# Patient Record
Sex: Male | Born: 1995 | Race: White | Hispanic: No | Marital: Married | Smoking: Never smoker
Health system: Southern US, Community
[De-identification: ages and names within clinical notes are randomized; demographics above are authoritative.]

## PROBLEM LIST (undated history)

## (undated) DIAGNOSIS — F909 Attention-deficit hyperactivity disorder, unspecified type: Secondary | ICD-10-CM

## (undated) HISTORY — PX: NO PAST SURGERIES: SHX2092

---
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
## 2014-05-23 ENCOUNTER — Ambulatory Visit (INDEPENDENT_AMBULATORY_CARE_PROVIDER_SITE_OTHER): Payer: Self-pay | Admitting: Sports Medicine

## 2014-05-23 ENCOUNTER — Encounter: Payer: Self-pay | Admitting: Sports Medicine

## 2014-05-23 VITALS — Ht 70.0 in | Wt 200.0 lb

## 2014-05-23 DIAGNOSIS — M24873 Other specific joint derangements of unspecified ankle, not elsewhere classified: Secondary | ICD-10-CM

## 2014-05-23 DIAGNOSIS — M24876 Other specific joint derangements of unspecified foot, not elsewhere classified: Secondary | ICD-10-CM

## 2014-05-23 DIAGNOSIS — G5620 Lesion of ulnar nerve, unspecified upper limb: Secondary | ICD-10-CM

## 2014-05-23 DIAGNOSIS — G5621 Lesion of ulnar nerve, right upper limb: Secondary | ICD-10-CM | POA: Insufficient documentation

## 2014-05-23 DIAGNOSIS — M25372 Other instability, left ankle: Secondary | ICD-10-CM

## 2014-05-23 MED ORDER — VITAMIN B-6 50 MG PO TABS: 50.0000 mg | ORAL_TABLET | Freq: Two times a day (BID) | ORAL | Status: DC

## 2014-05-23 NOTE — Progress Notes (Signed)
Subjective:    Patient ID: Casey Orozco, male    DOB: 05/26/1996, 18 y.oGwenyth Orozco.   MRN: [id number]  HPI Casey Orozco is a 18 year old male who presents with multiple complaints. Mainly he complains of right wrist pain and numbness, which he says started following an injury over a year ago. He describes that he was carrying a tire at work with the third fourth and fifth fingers and went to throw the tire, but felt immediate pain in his right elbow and wrist. His symptoms initially had resolved with bracing and ibuprofen, but he read aggravated this approximately 2-3 months ago. He notes mainly associated numbness of the fourth and fifth fingers, wrist pain, and numbness extending to the elbow. He denies any neck symptoms or numbness above the level of the elbow. He denies any snapping or popping sensation at the elbow or wrist. He denies any associated weakness. His pain does not wake him up at night.  He also complains of left ankle pain and instability across the anterior ankle joint. He denies any known acute injury. Onset was 4-5 months ago. He denies any swelling, bruising, giving way, numbness, tingling or weakness. Severity of pain is mild. He has not tried any interventions for this.  History   Social History  . Marital Status: Single    Spouse Name: N/A    Number of Children: N/A  . Years of Education: N/A   Occupational History  . 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 Smoking status: Never Smoker   . Smokeless tobacco: Not on file  . Alcohol Use: Not on file  . Drug Use: Not on file  . Sexual Activity: Not on file   Other Topics Concern  . Not on file   Social History Narrative  . No narrative on file   History reviewed. No pertinent past medical history.  Review of Systems As per history of present illness. 11 point review of systems was performed and is otherwise negative.    Objective:   Physical Exam Ht 5\' 10"  (1.778 m)  Wt 200 lb (90.719 kg)  BMI 28.70  kg/m2 GEN: The patient is well-developed well-nourished male and in no acute distress.  He is awake alert and oriented x3. SKIN: warm and well-perfused, no rash  EXTR: No lower extremity edema or calf tenderness Neuro: Strength 5/5 globally. Sensation intact throughout. DTRs 2/4 bilaterally. No focal deficits. Vasc: +2 bilateral distal pulses. No edema.  MSK: Examination of the right upper extremity reveals full range of motion at the fingers, elbow, and wrist without pain. Negative Tinel's sign at the wrist. No tenderness or pain with resisted flexion, extension, abduction, or abduction. No hypo-thenar wasting. No swelling or deformity. No palpable step or subluxation at the medial epicondyle with elbow flexion. No valgus or varus laxity at the elbow or wrist. Examination of the left ankle reveals a positive talar tilt test and +1 anterior drawer test. No tenderness to palpation. No swelling. No weakness with eversion or inversion of the ankle.  Limited musculoskeletal ultrasound: Short and long axis views were obtained of the right Upper extremity. The ulnar nerve has normal caliber and position of the wrist.  There was echogenicity seen within the TFCC at the wrist.  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  There was some disruption of the flexor retinaculum at the elbow.  The ulnar nerve at the elbow was with increased fluid density surrounding it.  The ulnar nerve  would move, but would still remain within the elbow groove with dynamic testing.    Assessment & Plan:  Please see problem based assessment and plan in the problem list.

## 2014-05-23 NOTE — Assessment & Plan Note (Signed)
-  Ultrasound dynamic testing demonstrated slight stretching of the flexor retinaculum containing the ulnar nerve at the elbow, which was also a bifid nerve with some fluid surrounding it. 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 -He is given a right elbow compression sleeve today. -We prescribed vitamin B6 50 mg twice daily for the next 2 months to support nerve healing. -We will see him back in 2-3 months if his symptoms persist.

## 2014-05-23 NOTE — Assessment & Plan Note (Signed)
-  We demonstrated ankle cone touches exercises, which he will continue at home. -We offered him a compression sleeve for the left ankle, but he will obtain a compression sleeve on his own. -He will followup in 2-3 months or sooner if symptoms acutely worsen.

## 2014-08-23 ENCOUNTER — Encounter: Payer: Self-pay | Admitting: Sports Medicine

## 2014-08-23 ENCOUNTER — Ambulatory Visit (INDEPENDENT_AMBULATORY_CARE_PROVIDER_SITE_OTHER): Payer: Self-pay | Admitting: Sports Medicine

## 2014-08-23 VITALS — BP 124/47

## 2014-08-23 DIAGNOSIS — M4000 Postural kyphosis, site unspecified: Secondary | ICD-10-CM | POA: Insufficient documentation

## 2014-08-23 DIAGNOSIS — G5621 Lesion of ulnar nerve, right upper limb: Secondary | ICD-10-CM

## 2014-08-23 DIAGNOSIS — M4004 Postural kyphosis, thoracic region: Secondary | ICD-10-CM

## 2014-08-23 NOTE — Assessment & Plan Note (Signed)
HEP per AVS

## 2014-08-23 NOTE — Patient Instructions (Signed)
Exercises:  HEAD UP AND BACK: Dead Lifts with 25# in each hand - with shoulder rolled back Back bend on with hands on the wall Wall stands with heels, buttock, shoulder, head on wall Bent over row 10# wts - pull shoulder blades back  Body Helix as needed Keep B6

## 2014-08-23 NOTE — Assessment & Plan Note (Addendum)
overall he seems to be markedly improved. 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  The single incident that the compression seemed to worsen likely represents some underlying swelling that is still in the area that was exacerbated with external compression.     Discussed appropriate prophylactic use of compression versus symptomatic treatment which likely exacerbated  Continue vitamin B6. Discussed no significant evidence supporting the use of this medication but given the minimal side effect profile and minimal cost worthwhile trying.

## 2014-08-23 NOTE — Progress Notes (Signed)
  Casey Orozco - 18 y.o. male MRN [id number]  Date of birth: 11/22/1995  CC & HPI:  Casey Orozco presents today for follow-up of: Right arm pain: Reports overall being significantly improved (60-70%).  He has been taking vitamin B6 and using the body helix compression sleeve around the elbow. He was compliant with this for quite some time but has recently been using this less often and trying to protect it by avoiding direct pressure over the medial elbow. He does report a single incident where he had minimal pain and put the body helix on after 2-3 hours of work and reported worsening of his symptoms. Overall he denies any significant weakness and overall the dysesthesia he was experiencing is significantly better.    he would also like to discuss family concerns for poor posture and kyphosis previously documented. He denies any significant upper extremity issues but would like recommendations for postural exercises  ROS:  Per HPI.   HISTORY: Past Medical, Surgical, Social, and Family History Reviewed & Updated per EMR.  Pertinent Historical Findings include: Works as a Journalist, newspaperauto mechanic    OBJECTIVE:  VS:   HT:    WT:   BMI:           BP:(!) 124/47 mmHg  HR: bpm  TEMP: ( )  RESP:   PHYSICAL EXAM: GENERAL: [place] adult Caucasian  male. In no discomfort; no respiratory distress   PSYCH: alert and appropriate, good insight   NEURO: sensation is intact to light touch in bilateral upper extremities, no dysesthesia at the hand   VASCULAR:  bilateral radial pulses 2+/4.  No significant edema.    Right upper extremity EXAM: Appearance:  overall normal-appearing, no significant swelling or deformity. No thenar or hypothenar atrophy   Skin: No overlying erythema/ecchymosis.  Palpation:  minimal TTP over the cubital tunnel. No significant swelling appreciated No TTP over: Distal forearm or hand   Strength, ROM & OtherTests:  grip strength 5 out of 5, wrist extension and wrist flexion and wrist on the  deviation and radial deviation strength 5 out of 5 with no limitations in range of motion Negative Tinel's at the cubital tunnel, positive Tinel's at Guyon's canal.    Posture/back EXAM: Overall anterior chain dominant. Bilateral shoulder protraction. Mild kyphosis without significant scoliosis. No overlying erythema/ecchymosis. No TTP over: Bilateral paraspinal musculature.  He does have a flexible kyphosis and full cervical range of motion.   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 Postural kyphosis of thoracic region    PLAN: See problem based charting & AVS for additional documentation. > Return if symptoms worsen or fail to improve.

## 2015-09-09 ENCOUNTER — Ambulatory Visit (INDEPENDENT_AMBULATORY_CARE_PROVIDER_SITE_OTHER): Payer: BLUE CROSS/BLUE SHIELD | Admitting: Family Medicine

## 2015-09-09 ENCOUNTER — Encounter: Payer: Self-pay | Admitting: Family Medicine

## 2015-09-09 VITALS — BP 126/70 | Ht 71.0 in | Wt 204.0 lb

## 2015-09-09 DIAGNOSIS — M791 Myalgia: Secondary | ICD-10-CM | POA: Diagnosis not present

## 2015-09-09 DIAGNOSIS — M7918 Myalgia, other site: Secondary | ICD-10-CM

## 2015-09-09 NOTE — Progress Notes (Signed)
   Subjective:    Patient ID: Casey Orozco, male    DOB: 02/22/1996, 19 y.o.   MRN: [id number]  HPI Mr. Casey Orozco is a 19yo male presenting today for right shoulder pain. 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 Pain improves after 1-2 days off of work. - Has been using Ibuprofen, which helps - Denies decreased ROM, decreased strength, numbness, and tingling - Reports history of aggravated nerve in elbow which lasted from October 2015 to the spring of 2016. - Right hand dominant   Review of Systems Per HPI    Objective:   Physical Exam  Constitutional: He appears well-developed and well-nourished. No distress.  Pulmonary/Chest: Effort normal. No respiratory distress.  Neurological:  Shoulders symmetric bilaterally. No tenderness to palpation over shoulder or scapula. No numbness noted. Muscle strength 5/5 in upper extremities bilaterally. No winging of scapula noted. Negative Hawkins. Negative O'Briens. Negative cross body with resistance.   Psychiatric: He has a normal mood and affect. His behavior is normal.      Assessment & Plan:  # 1: Rhomboid Tendonitis: Exam negative given rest over last 2-3 days, however reports intermittent pain at site of right rhomboid. Currently taking Ibuprofen PRN pain.  Plan: - Continue Ibuprofen PRN for pain - Exercises given, including push ups to target rhomboids - Follow up as needed.

## 2015-09-11 NOTE — Progress Notes (Signed)
Patient ID: Casey Orozco, male   DOB: 12/28/1995, 19 y.o.   MRN: [id number] [facility] Attending Note: I have seen and examined this patient. I have discussed this patient with the resident and reviewed the assessment and plan as documented above. I agree with the resident's findings and plan. Rhomboid muscle strain.  We discussed exercises.  He can follow-up when necessary.  If it does not improve, I would like him to return.

## 2017-02-24 ENCOUNTER — Encounter (INDEPENDENT_AMBULATORY_CARE_PROVIDER_SITE_OTHER): Payer: Self-pay

## 2017-02-24 ENCOUNTER — Ambulatory Visit (INDEPENDENT_AMBULATORY_CARE_PROVIDER_SITE_OTHER): Payer: BLUE CROSS/BLUE SHIELD | Admitting: Allergy and Immunology

## 2017-02-24 ENCOUNTER — Encounter: Payer: Self-pay | Admitting: Allergy and Immunology

## 2017-02-24 VITALS — BP 134/72 | HR 84 | Temp 97.7°F | Resp 16 | Ht 70.0 in | Wt 212.8 lb

## 2017-02-24 DIAGNOSIS — J3089 Other allergic rhinitis: Secondary | ICD-10-CM | POA: Diagnosis not present

## 2017-02-24 DIAGNOSIS — H1045 Other chronic allergic conjunctivitis: Secondary | ICD-10-CM

## 2017-02-24 DIAGNOSIS — H1010 Acute atopic conjunctivitis, unspecified eye: Secondary | ICD-10-CM

## 2017-02-24 MED ORDER — MONTELUKAST SODIUM 10 MG PO TABS: 10.0000 mg | ORAL_TABLET | Freq: Every day | ORAL | 5 refills | Status: DC

## 2017-02-24 MED ORDER — OLOPATADINE HCL 0.2 % OP SOLN: 1.0000 [drp] | Freq: Every day | OPHTHALMIC | 5 refills | Status: DC | PRN

## 2017-02-24 MED ORDER — TRIAMCINOLONE ACETONIDE 55 MCG/ACT NA AERO: 1.0000 | INHALATION_SPRAY | Freq: Every day | NASAL | 5 refills | Status: AC | PRN

## 2017-02-24 NOTE — Progress Notes (Signed)
Dear Dr. Waynard EdwardsPerini,  Thank you for referring Casey Orozco to the [facility] [place] on 02/24/2017.   Below is a summation of this patient's evaluation and recommendations.  Thank you for your referral. I will keep you informed about this patient's response to treatment.   If you have any questions please do not hesitate to contact me.   Sincerely,  Jessica PriestEric J. Kozlow, MD Allergy / Immunology [facility]   ______________________________________________________________________    NEW PATIENT NOTE  Referring Provider: Rodrigo RanPerini, Mark, MD Primary Provider: Rodrigo RanPerini, Mark, MD Date of office visit: 02/24/2017    Subjective:   Chief Complaint:  Casey Orozco (DOB: 01/29/1996) is a 21 y.o. male who presents to the clinic on 02/24/2017 with a chief complaint of New Evaluation and Allergy Testing (No antihistamines x 3 days. ) .     HPI: Casey Orozco presents to this clinic in evaluation of allergies. He has a several year history of perennial issues with nasal congestion and sneezing and itchy red watery eyes that flare during the spring time and appear to be triggered by exposure to the outdoors and exposure to dust. He does not have any significant headaches other than some sinus pressure during the peak of his allergy season and he does not have a history of anosmia or decreased ability to taste or ugly nasal discharge.  He has tried various antihistamines and nasal steroids in the past which do help but he still remains symptomatic in the face of this therapy.  He does not have any active atopic disease other than described above. He did have a history of early eczema that has since resolved.  History reviewed. No pertinent past medical history.  Past Surgical History:  Procedure Laterality Date  . NO PAST SURGERIES      Allergies as of 02/24/2017      Reactions   Ampicillin       Medication List        CONCERTA 36 MG CR tablet Generic drug:  methylphenidate   fexofenadine 180 MG tablet Commonly known as:  ALLEGRA Take 180 mg by mouth daily.   NASACORT ALLERGY 24HR NA Place 1 spray into the nose daily as needed.       Review of systems negative except as noted in HPI / PMHx or noted below:  Review of Systems  Constitutional: Negative.   HENT: Negative.   Eyes: Negative.   Respiratory: Negative.   Cardiovascular: Negative.   Gastrointestinal: Negative.   Genitourinary: Negative.   Musculoskeletal: Negative.   Skin: Negative.   Neurological: Negative.   Endo/Heme/Allergies: Negative.   Psychiatric/Behavioral: Negative.     Family History  Problem Relation Age of Onset  . Allergic rhinitis Father   . Angioedema Neg Hx   . Atopy Neg Hx   . Eczema Neg Hx   . Immunodeficiency Neg Hx   . Urticaria Neg Hx   . Asthma Neg Hx     Social History   Social History  . Marital status: Single    Spouse name: N/A  . Number of children: N/A  . Years of education: N/A   Occupational History  . 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 Smoking status: Never Smoker  . Smokeless tobacco: Never Used  . Alcohol use Yes     Comment: occasional   . Drug use: No  . Sexual activity: Not on file  Other Topics Concern  . Not on file   Social History Narrative  . No narrative on file    Environmental and Social history  Lives in a house with a dry environment, a dog located inside the household, carpeting in the bedroom, no plastic on the bed or pillow, and no smoking ongoing with inside the household. 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  Objective:   Vitals:   02/24/17 0841  BP: 134/72  Pulse: 84  Resp: 16  Temp: 97.7 F (36.5 C)   Height: 5\' 10"  (177.8 cm) Weight: 212 lb 12.8 oz (96.5 kg)  Physical Exam  Constitutional: He is well-developed, well-nourished, and in no distress.  Allergic shiners  HENT:   Head: Normocephalic. Head is without right periorbital erythema and without left periorbital erythema.  Right Ear: Tympanic membrane, external ear and ear canal normal.  Left Ear: Tympanic membrane, external ear and ear canal normal.  Nose: Mucosal edema and septal deviation present. No rhinorrhea.  Mouth/Throat: Uvula is midline, oropharynx is clear and moist and mucous membranes are normal. No oropharyngeal exudate.  Eyes: Conjunctivae and lids are normal. Pupils are equal, round, and reactive to light.  Neck: Trachea normal. No tracheal tenderness present. No tracheal deviation present. No thyromegaly present.  Cardiovascular: Normal rate, regular rhythm, S1 normal, S2 normal and normal heart sounds.   No murmur heard. Pulmonary/Chest: Effort normal and breath sounds normal. No stridor. No tachypnea. No respiratory distress. He has no wheezes. He has no rales. He exhibits no tenderness.  Abdominal: Soft. He exhibits no distension and no mass. There is no hepatosplenomegaly. There is no tenderness. There is no rebound and no guarding.  Musculoskeletal: He exhibits no edema or tenderness.  Lymphadenopathy:       Head (right side): No tonsillar adenopathy present.       Head (left side): No tonsillar adenopathy present.    He has no cervical adenopathy.    He has no axillary adenopathy.  Neurological: He is alert. Gait normal.  Skin: No rash noted. He is not diaphoretic. No erythema. No pallor. Nails show no clubbing.  Psychiatric: Mood and affect normal.    Diagnostics: Allergy skin tests were performed. He demonstrated hypersensitivity to trees, weeds, house dust mite, and cat.  Assessment and Plan:    1. Other allergic rhinitis   2. Seasonal allergic conjunctivitis     1. Allergen avoidance measures  2. Treat and prevent inflammation:   A. OTC Nasacort / Rhinocort - one spray each nostril one time per day  B. montelukast 10 mg tablet 1 time per day  3. If needed:   A.  nasal saline wash  B. OTC antihistamine  C. Pataday one drop each eye one time per day  4. Consider a course of immunotherapy  5. Return to clinic in 6 months or earlier if problem  Ladene Artist appears to have atopic disease that has not responded adequately to previous medical treatment and I'm not really sure that he is going to get an adequate response with the therapy described above. I have given him literature on immunotherapy to treat his atopic condition during today's visit and he is presently considering this option. I will see him back in this clinic in 6 months or earlier if there is a problem.  Jessica Priest, MD  [facility]

## 2017-02-24 NOTE — Patient Instructions (Addendum)
  1. Allergen avoidance measures  2. Treat and prevent inflammation:   A. OTC Nasacort / Rhinocort - one spray each nostril one time per day  B. montelukast 10 mg tablet 1 time per day  3. If needed:   A. nasal saline wash  B. OTC antihistamine  C. Pataday one drop each eye one time per day  4. Consider a course of immunotherapy  5. Return to clinic in 6 months or earlier if problem

## 2017-05-10 ENCOUNTER — Encounter (INDEPENDENT_AMBULATORY_CARE_PROVIDER_SITE_OTHER): Payer: Self-pay

## 2017-05-10 ENCOUNTER — Ambulatory Visit (HOSPITAL_COMMUNITY): Payer: BLUE CROSS/BLUE SHIELD | Attending: Internal Medicine

## 2017-05-10 ENCOUNTER — Other Ambulatory Visit: Payer: Self-pay

## 2017-05-10 ENCOUNTER — Other Ambulatory Visit: Payer: Self-pay | Admitting: Internal Medicine

## 2017-05-10 DIAGNOSIS — R011 Cardiac murmur, unspecified: Secondary | ICD-10-CM | POA: Diagnosis present

## 2019-06-30 ENCOUNTER — Other Ambulatory Visit: Payer: Self-pay

## 2019-06-30 DIAGNOSIS — Z20822 Contact with and (suspected) exposure to covid-19: Secondary | ICD-10-CM

## 2019-07-01 LAB — NOVEL CORONAVIRUS, NAA: SARS-CoV-2, NAA: NOT DETECTED

## 2020-08-17 ENCOUNTER — Encounter (HOSPITAL_COMMUNITY): Payer: Self-pay | Admitting: *Deleted

## 2020-08-17 ENCOUNTER — Other Ambulatory Visit: Payer: Self-pay

## 2020-08-17 ENCOUNTER — Ambulatory Visit (HOSPITAL_COMMUNITY)
Admission: EM | Admit: 2020-08-17 | Discharge: 2020-08-17 | Disposition: A | Discharge status: Home / Self Care | Attending: Emergency Medicine | Admitting: Emergency Medicine

## 2020-08-17 DIAGNOSIS — I493 Ventricular premature depolarization: Secondary | ICD-10-CM | POA: Diagnosis present

## 2020-08-17 DIAGNOSIS — R002 Palpitations: Secondary | ICD-10-CM | POA: Diagnosis present

## 2020-08-17 HISTORY — DX: Attention-deficit hyperactivity disorder, unspecified type: F90.9

## 2020-08-17 LAB — BASIC METABOLIC PANEL
Anion gap: 10 (ref 5–15)
BUN: 10 mg/dL (ref 6–20)
CO2: 28 mmol/L (ref 22–32)
Calcium: 9.9 mg/dL (ref 8.9–10.3)
Chloride: 100 mmol/L (ref 98–111)
Creatinine, Ser: 0.92 mg/dL (ref 0.61–1.24)
GFR, Estimated: >60 mL/min (ref >60)
Glucose, Bld: 103 mg/dL — ABNORMAL HIGH (ref 70–99)
Potassium: 3.9 mmol/L (ref 3.5–5.1)
Sodium: 138 mmol/L (ref 135–145)

## 2020-08-17 LAB — CBC
HCT: 46.3 % (ref 39.0–52.0)
Hemoglobin: 15.6 g/dL (ref 13.0–17.0)
MCH: 28.6 pg (ref 26.0–34.0)
MCHC: 33.7 g/dL (ref 30.0–36.0)
MCV: 85 fL (ref 80.0–100.0)
Platelets: 291 10*3/uL (ref 150–400)
RBC: 5.45 MIL/uL (ref 4.22–5.81)
RDW: 12.2 % (ref 11.5–15.5)
WBC: 7.3 10*3/uL (ref 4.0–10.5)
nRBC: 0 % (ref 0.0–0.2)

## 2020-08-17 LAB — TSH: TSH: 1.244 u[IU]/mL (ref 0.350–4.500)

## 2020-08-17 NOTE — ED Notes (Signed)
Pt in registration bay.  Upon palpation, HR regular with occasional skipped beats.  Skin warm and dry.  Pt talking calmly.

## 2020-08-17 NOTE — Discharge Instructions (Signed)
Please be sure to stay hydrated, avoid caffeine temporarily Follow-up with cardiology next week if symptoms persisting I'm checking blood work, I will call if anything is abnormal

## 2020-08-17 NOTE — ED Provider Notes (Signed)
[facility]    CSN: [id number] Arrival date & time: 08/17/20  1340      History   Chief Complaint Chief Complaint  Patient presents with  . Irregular Heart Beat    HPI Casey Orozco is a 24 y.o. male presenting today for evaluation of palpitations.  Reports that he has had sensations of his heart skipping a beat every 4-15 beats.  He denies any associated chest pain or lightheadedness.  Occasionally when symptoms occur frequently he will feel slightly short of breath/week. Reports with exertion, more frequent. 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 Reports increased stress recently. 1 cup of coffee daily, normal. Denies tobacco, recreational drugs. Rare alcohol use. Denies any history of heart problems. Denies any associated dizziness or lightheadedness. Symptoms take breath away briefly, but denies any shortness of breath or persistent issues with breathing.  HPI  Past Medical History:  Diagnosis Date  . ADHD     Patient Active Problem List   Diagnosis Date Noted  . 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 NO PAST SURGERIES         Home Medications    Prior to Admission medications   Medication Sig Start Date End Date Taking? Authorizing Provider  CONCERTA 36 MG CR tablet  08/27/15   [provider]  fexofenadine (ALLEGRA) 180 MG tablet Take 180 mg by mouth daily.    [provider]  montelukast (SINGULAIR) 10 MG tablet Take 1 tablet (10 mg total) by mouth at bedtime. 02/24/17   Kozlow, Alvira Philips, MD  Olopatadine HCl (PATADAY) 0.2 % SOLN Place 1 drop into both eyes daily as needed. 02/24/17   Kozlow, Alvira Philips, MD  triamcinolone (NASACORT ALLERGY 24HR) 55 MCG/ACT AERO nasal inhaler Place 1 spray into the nose daily as needed. 02/24/17   Kozlow, Alvira Philips, MD    Family History Family History   Problem Relation Age of Onset  . Allergic rhinitis Father   . Healthy Father   . Healthy Mother   . Angioedema Neg Hx   . Atopy Neg Hx   . Eczema Neg Hx   . Immunodeficiency Neg Hx   . Urticaria Neg Hx   . Asthma Neg Hx     Social History Social History   Tobacco Use  . Smoking status: Never Smoker  . Smokeless tobacco: Never Used  Vaping Use  . Vaping Use: Never assessed  Substance Use Topics  . Alcohol use: Yes    Comment: rare  . Drug use: Never     Allergies   Ampicillin   Review of Systems Review of Systems  Constitutional: Negative for fatigue and fever.  Eyes: Negative for redness, itching and visual disturbance.  Respiratory: Negative for shortness of breath.   Cardiovascular: Positive for palpitations. Negative for chest pain and leg swelling.  Gastrointestinal: Negative for nausea and vomiting.  Musculoskeletal: Negative for arthralgias and myalgias.  Skin: Negative for color change, rash and wound.  Neurological: Negative for dizziness, syncope, weakness, light-headedness and headaches.     Physical Exam Triage Vital Signs ED Triage Vitals  Enc Vitals Group     BP 08/17/20 1350 (!) 145/81     Pulse Rate 08/17/20 1350 86     Resp 08/17/20 1350 16     Temp 08/17/20 1350 98.4 F (36.9 C)  Temp Source 08/17/20 1350 Oral     SpO2 08/17/20 1350 98 %     Weight --      Height --      Head Circumference --      Peak Flow --      Pain Score 08/17/20 1352 0     Pain Loc --      Pain Edu? --      Excl. in GC? --    No data found.  Updated Vital Signs BP (!) 145/81   Pulse 86   Temp 98.4 F (36.9 C) (Oral)   Resp 16   SpO2 98%   Visual Acuity Right Eye Distance:   Left Eye Distance:   Bilateral Distance:    Right Eye Near:   Left Eye Near:    Bilateral Near:     Physical Exam Vitals and nursing note reviewed.  Constitutional:      Appearance: He is well-developed.     Comments: No acute distress  HENT:     Head:  Normocephalic and atraumatic.     Nose: Nose normal.  Eyes:     Conjunctiva/sclera: Conjunctivae normal.  Cardiovascular:     Rate and Rhythm: Normal rate. Rhythm irregular.     Comments: Regular with occasional premature beat Pulmonary:     Effort: Pulmonary effort is normal. No respiratory distress.     Comments: Breathing comfortably at rest, CTABL, no wheezing, rales or other adventitious sounds auscultated Abdominal:     General: There is no distension.  Musculoskeletal:        General: Normal range of motion.     Cervical back: Neck supple.  Skin:    General: Skin is warm and dry.  Neurological:     Mental Status: He is alert and oriented to person, place, and time.      UC Treatments / Results  Labs (all labs ordered are listed, but only abnormal results are displayed) Labs Reviewed  CBC  BASIC METABOLIC PANEL  TSH    EKG   Radiology No results found.  Procedures Procedures (including critical care time)  Medications Ordered in UC Medications - No data to display  Initial Impression / Assessment and Plan / UC Course  I have reviewed the triage vital signs and the nursing notes.  Pertinent labs & imaging results that were available during my care of the patient were reviewed by me and considered in my medical decision making (see chart for details).     EKG normal sinus rhythm with occasional PVC. PVC auscultated approximately every 10 beats. Given new and symptomatic to patient will have follow-up with cardiology for further evaluation. Recommended to avoid caffeine temporarily, checking basic labs to ensure electrolytes thyroid and hemoglobin stable.  Discussed strict return precautions. Patient verbalized understanding and is agreeable with plan.  Final Clinical Impressions(s) / UC Diagnoses   Final diagnoses:  Palpitations  Frequent PVCs     Discharge Instructions     Please be sure to stay hydrated, avoid caffeine temporarily Follow-up with  cardiology next week if symptoms persisting I'm checking blood work, I will call if anything is abnormal     ED Prescriptions    None     PDMP not reviewed this encounter.   Lew Dawes, PA-C 08/17/20 1418

## 2020-08-17 NOTE — ED Triage Notes (Signed)
Pt reports intermittent episodes of feeling palpitations.  States has noticed skipped beats every 4-15 beats.  Denies any chest pain or lightheadedness.  States "when it happens frequently and closer together, I feel a little weak or SOB".

## 2020-09-14 DIAGNOSIS — I493 Ventricular premature depolarization: Secondary | ICD-10-CM | POA: Insufficient documentation

## 2020-09-14 NOTE — Progress Notes (Signed)
Cardiology Office Note   Date:  09/16/2020   ID:  Casey Orozco, DOB May 24, 1996, MRN [id number]  PCP:  Rodrigo Ran, MD  Cardiologist:   No primary care provider on file. Referring:  Rodrigo Ran, MD  Chief Complaint  Patient presents with  . Palpitations      History of Present Illness: Casey Orozco is a 24 y.o. male who is referred by Rodrigo Ran, MD for evaluation of palpitations.   He was in the ED for this.   I reviewed these records for this visit.   He had PVCs about every 10 beats.     These appear to be premature atrial contractions.  He said this was going on at that time really on the day before he presented.  It was happening every several beats.  It was persistent for about 3 days.  In the emergency room the EKG demonstrated the ectopic beats.  TSH was normal.  Basic metabolic profile was normal.  He subsequently had resolution of his symptoms.  He has no prior cardiac history.  He is active as a Music therapist.  He denies any cardiovascular symptoms otherwise.  He has no chest discomfort, neck or arm discomfort.  He has no shortness of breath, PND or orthopnea.  He has had no weight gain or edema.  Of note he has an 57-month-old child and he just had premature 55-month-old twins.  They are selling a house and getting ready to buy another house.  Past Medical History:  Diagnosis Date  . ADHD     Past Surgical History:  Procedure Laterality Date  . NO PAST SURGERIES       Current Outpatient Medications  Medication Sig Dispense Refill  . triamcinolone (NASACORT ALLERGY 24HR) 55 MCG/ACT AERO nasal inhaler Place 1 spray into the nose daily as needed. 1 Inhaler 5   No current facility-administered medications for this visit.    Allergies:   Ampicillin    Social History:  The patient  reports that he has never smoked. He has never used smokeless tobacco. He reports current alcohol use. He reports that he does not use drugs.   Family History:  The patient's family  history includes Allergic rhinitis in his father; Healthy in his father and mother; Hypertension in his father.    ROS:  Please see the history of present illness.   Otherwise, review of systems are positive for none.   All other systems are reviewed and negative.    PHYSICAL EXAM: VS:  BP 128/62   Pulse 77   Ht 5\' 11"  (1.803 m)   Wt 243 lb 12.8 oz (110.6 kg)   BMI 34.00 kg/m  , BMI Body mass index is 34 kg/m. GENERAL:  Well appearing HEENT:  Pupils equal round and reactive, fundi not visualized, oral mucosa unremarkable NECK:  No jugular venous distention, waveform within normal limits, carotid upstroke brisk and symmetric, no bruits, no thyromegaly LYMPHATICS:  No cervical, inguinal adenopathy LUNGS:  Clear to auscultation bilaterally BACK:  No CVA tenderness CHEST:  Unremarkable HEART:  PMI not displaced or sustained,S1 and S2 within normal limits, no S3, no S4, no clicks, no rubs, no murmurs ABD:  Flat, positive bowel sounds normal in frequency in pitch, no bruits, no rebound, no guarding, no midline pulsatile mass, no hepatomegaly, no splenomegaly EXT:  2 plus pulses throughout, no edema, no cyanosis no clubbing SKIN:  No rashes no nodules NEURO:  Cranial nerves II through XII grossly intact, motor grossly  intact throughout [facility]:  Cognitively intact, oriented to person place and time    EKG:  EKG is ordered today. The ekg ordered today demonstrates sinus rhythm, rate 77, axis within normal limits, intervals within normal limits, no acute ST-T wave changes.   Recent Labs: 08/17/2020: BUN 10; Creatinine, Ser 0.92; Hemoglobin 15.6; Platelets 291; Potassium 3.9; Sodium 138; TSH 1.244    Lipid Panel No results found for: CHOL, TRIG, HDL, CHOLHDL, VLDL, LDLCALC, LDLDIRECT    Wt Readings from Last 3 Encounters:  09/16/20 243 lb 12.8 oz (110.6 kg)  02/24/17 212 lb 12.8 oz (96.5 kg)  09/09/15 204 lb (92.5 kg)      Other studies Reviewed: Additional studies/ records that  were reviewed today include: ED records. Review of the above records demonstrates:  Please see elsewhere in the note.     ASSESSMENT AND PLAN:  Palpitations: The patient has premature ectopic contractions which appear to be atrial with aberrancy.  However, he has a normal physical exam.  Labs were unremarkable.  They seem to have resolved.  He has lots of physical and emotional stress.  He actually seems to handle this relatively well.  At this point I doubt that he is having any structural heart disease and I do not suspect more significant dysrhythmia.  We talked about possible as needed dosing with a beta-blocker but he would like to hold off.  He will let me know if he would like propranolol in the future as needed.  No further testing at this point.   Current medicines are reviewed at length with the patient today.  The patient does not have concerns regarding medicines.  The following changes have been made:  no change  Labs/ tests ordered today include: None  Orders Placed This Encounter  Procedures  . EKG 12-Lead     Disposition:   FU with me as needed   Signed, Rollene Rotunda, MD  09/16/2020 10:24 AM    [facility]

## 2020-09-16 ENCOUNTER — Ambulatory Visit (INDEPENDENT_AMBULATORY_CARE_PROVIDER_SITE_OTHER): Admitting: Cardiology

## 2020-09-16 ENCOUNTER — Encounter: Payer: Self-pay | Admitting: Cardiology

## 2020-09-16 ENCOUNTER — Other Ambulatory Visit: Payer: Self-pay

## 2020-09-16 VITALS — BP 128/62 | HR 77 | Ht 71.0 in | Wt 243.8 lb

## 2020-09-16 DIAGNOSIS — R002 Palpitations: Secondary | ICD-10-CM

## 2020-09-16 NOTE — Patient Instructions (Signed)
Medication Instructions:  No changes *If you need a refill on your cardiac medications before your next appointment, please call your pharmacy*  Lab Work: None ordered this visit  Testing/Procedures: None ordered  Follow-Up: At [facility], you and your health needs are our priority.  As part of our continuing mission to provide you with exceptional heart care, we have created designated Provider Care Teams.  These Care Teams include your primary Cardiologist (physician) and Advanced Practice Providers (APPs -  Physician Assistants and Nurse Practitioners) who all work together to provide you with the care you need, when you need it.  We recommend signing up for the patient portal called "MyChart".  Sign up information is provided on this After Visit Summary.  MyChart is used to connect with patients for Virtual Visits (Telemedicine).  Patients are able to view lab/test results, encounter notes, upcoming appointments, etc.  Non-urgent messages can be sent to your provider as well.   To learn more about what you can do with MyChart, go to [email or web address].    Your next appointment:   Follow up as needed

## 2023-11-18 ENCOUNTER — Other Ambulatory Visit (HOSPITAL_COMMUNITY): Payer: Self-pay | Admitting: Adult Health

## 2023-11-18 ENCOUNTER — Ambulatory Visit (HOSPITAL_COMMUNITY)
Admission: RE | Admit: 2023-11-18 | Discharge: 2023-11-18 | Disposition: A | Discharge status: Home / Self Care | Payer: Medicaid Other | Source: Ambulatory Visit | Attending: Adult Health | Admitting: Adult Health

## 2023-11-18 ENCOUNTER — Other Ambulatory Visit: Payer: Self-pay | Admitting: Adult Health

## 2023-11-18 DIAGNOSIS — M545 Low back pain, unspecified: Secondary | ICD-10-CM | POA: Insufficient documentation

## 2023-11-18 DIAGNOSIS — D72825 Bandemia: Secondary | ICD-10-CM

## 2023-11-18 DIAGNOSIS — R1030 Lower abdominal pain, unspecified: Secondary | ICD-10-CM

## 2023-11-18 MED ORDER — IOHEXOL 300 MG/ML  SOLN
100.0000 mL | Freq: Once | INTRAMUSCULAR | Status: AC | PRN
  Administered 2023-11-18: 100 mL via INTRAVENOUS

## 2023-11-18 MED ORDER — IOHEXOL 300 MG/ML  SOLN
30.0000 mL | Freq: Once | INTRAMUSCULAR | Status: AC | PRN
  Administered 2023-11-18: 30 mL via ORAL

## 2023-11-23 ENCOUNTER — Other Ambulatory Visit
